# Patient Record
Sex: Female | Born: 1960 | Hispanic: Yes | Marital: Married | Smoking: Never smoker
Health system: Southern US, Community
[De-identification: ages and names within clinical notes are randomized; demographics above are authoritative.]

## PROBLEM LIST (undated history)

## (undated) DIAGNOSIS — G43909 Migraine, unspecified, not intractable, without status migrainosus: Secondary | ICD-10-CM

## (undated) DIAGNOSIS — M1990 Unspecified osteoarthritis, unspecified site: Secondary | ICD-10-CM

## (undated) HISTORY — DX: Unspecified osteoarthritis, unspecified site: M19.90

## (undated) HISTORY — DX: Migraine, unspecified, not intractable, without status migrainosus: G43.909

## (undated) HISTORY — PX: NO PAST SURGERIES: SHX2092

---

## 2009-08-29 ENCOUNTER — Ambulatory Visit: Payer: Self-pay | Admitting: Family

## 2010-11-27 ENCOUNTER — Ambulatory Visit: Payer: Self-pay | Admitting: Family

## 2011-12-03 ENCOUNTER — Ambulatory Visit: Payer: Self-pay

## 2011-12-05 ENCOUNTER — Ambulatory Visit: Payer: Self-pay

## 2012-12-29 ENCOUNTER — Ambulatory Visit: Payer: Self-pay

## 2012-12-29 ENCOUNTER — Ambulatory Visit: Payer: Self-pay | Admitting: Family Medicine

## 2014-01-30 ENCOUNTER — Ambulatory Visit: Payer: Self-pay

## 2015-06-20 ENCOUNTER — Ambulatory Visit
Admission: RE | Admit: 2015-06-20 | Discharge: 2015-06-20 | Disposition: A | Discharge status: Home / Self Care | Payer: Self-pay | Source: Ambulatory Visit | Attending: Oncology | Admitting: Oncology

## 2015-06-20 ENCOUNTER — Ambulatory Visit: Payer: Self-pay | Attending: Oncology

## 2015-06-20 VITALS — BP 100/69 | HR 73 | Temp 98.1°F | Resp 16 | Ht 61.81 in | Wt 135.1 lb

## 2015-06-20 DIAGNOSIS — Z0000 Encounter for general adult medical examination without abnormal findings: Secondary | ICD-10-CM

## 2015-06-20 NOTE — Progress Notes (Signed)
Subjective:     Patient ID: Theresa Olsen, female   DOB: 04/03/1960, 55 y.o.   MRN: [id number]  HPI   Review of Systems     Objective:   Physical Exam  Pulmonary/Chest: Right breast exhibits no inverted nipple, no mass, no nipple discharge, no skin change and no tenderness. Left breast exhibits no inverted nipple, no mass, no nipple discharge, no skin change and no tenderness. Breasts are symmetrical.  Genitourinary: No labial fusion. There is no rash, tenderness, lesion or injury on the right labia. There is no rash, tenderness, lesion or injury on the left labia. Uterus is not deviated, not enlarged, not fixed and not tender. Cervix exhibits no motion tenderness, no discharge and no friability. Right adnexum displays no mass, no tenderness and no fullness. Left adnexum displays tenderness. Left adnexum displays no mass and no fullness. No erythema, tenderness or bleeding in the vagina. No foreign body around the vagina. No signs of injury around the vagina. No vaginal discharge found.         Assessment:     55 year old Hispanic patient presents for [facility] clinic visit.  Patient screened, and meets [facility] eligibility.  Patient does not have insurance, Medicare or Medicaid.  Handout given on Affordable Care Act.  CBE unremarkable.  No mass or lump palpated.  Patient reports she is having intermittent , sharp pelvic pain.  States she had a pelvic ultrasound in the past [facility] on [address], but does not know results.  She is also having joint, and back pain, and states [facility] at [facility], but has not received any results. Will request results to be faxed.  Pelvis exam reveal a bright red polyp in cervical os.  Slight bleeding on collection of pap specimen.  Left sided pelvic tenderness on palpation.     Plan:     Sent for bilateral screening mammogram.  Consult appointment for cervical polyp with Dr. Greggory KeeneFrancesco on June 22,2017 at 10 o'clock.   [facility] office to schedule interpreter.

## 2015-06-23 LAB — PAP LB AND HPV HIGH-RISK
HPV, high-risk: NEGATIVE
PAP SMEAR COMMENT: 0

## 2015-07-19 ENCOUNTER — Ambulatory Visit (INDEPENDENT_AMBULATORY_CARE_PROVIDER_SITE_OTHER): Payer: Self-pay | Admitting: Obstetrics and Gynecology

## 2015-07-19 ENCOUNTER — Encounter: Payer: Self-pay | Admitting: Obstetrics and Gynecology

## 2015-07-19 ENCOUNTER — Other Ambulatory Visit: Payer: Self-pay | Admitting: Obstetrics and Gynecology

## 2015-07-19 VITALS — BP 109/72 | HR 81 | Ht 62.0 in | Wt 135.9 lb

## 2015-07-19 DIAGNOSIS — N951 Menopausal and female climacteric states: Secondary | ICD-10-CM

## 2015-07-19 DIAGNOSIS — N841 Polyp of cervix uteri: Secondary | ICD-10-CM

## 2015-07-19 DIAGNOSIS — N952 Postmenopausal atrophic vaginitis: Secondary | ICD-10-CM

## 2015-07-19 DIAGNOSIS — N852 Hypertrophy of uterus: Secondary | ICD-10-CM

## 2015-07-19 DIAGNOSIS — N941 Unspecified dyspareunia: Secondary | ICD-10-CM

## 2015-07-19 NOTE — Patient Instructions (Signed)
1. Endocervical polyp is removed 2. Ultrasound is to be scheduled for enlarged uterus 3.  Follow-up after ultrasound to review results and further management planning  Cervical Biopsy A cervical biopsy is a procedure to remove a small sample of tissue from the cervix. The cervix is the lowest part of the womb (uterus), which opens into the vagina (birth canal). You may have this procedure to check for cancer or growths that may become cancer. This procedure may also be done if the results of your Pap test were abnormal or if your health care provider saw an abnormality during a pelvic exam. LET [facility] PROVID[facility] KNOW ABOUT:   Any allergies you have.   All medicines you are taking, including vitamins, herbs, eye drops, creams, and over-the-counter medicines.  Previous problems you or members of your family have had with the use of anesthetics.   Any blood disorders you have.  Previous surgeries you have had.  Any medical conditions you have.  Whether you are pregnant or may be pregnant.  Whether you are having your menstrual period or will be having your period at the time of the procedure. RISKS AND COMPLICATIONS Generally, this is a safe procedure. However, problems may occur, including:  Infection.  Bleeding.  Allergic reactions to medicines or dyes.  Damage to other structures or organs. BEFORE THE PROCEDURE  Do not douche, have sex, use tampons, or use any vaginal medicines before the procedure as told by your health care provider.  Follow instructions from your health care provider about eating or drinking restrictions.  Ask your health care provider about:  Changing or stopping your regular medicines. This is especially important if you are taking diabetes medicines or blood thinners.  Taking medicines such as aspirin and ibuprofen. These medicines can thin your blood. Do not take these medicines before your procedure if your health care provider instructs  you not to.  You may be given an over-the-counter pain medicine to take right before the procedure.  You may be asked to empty your bladder and bowel right before the procedure. PROCEDURE   You will undress from the waist down.  You will lie on an examining table and put your feet in stirrups.  To reduce your risk of infection:  Your health care team will wash or sanitize their hands.  Your skin will be washed with soap.  Your health care provider will use a lubricated instrument (speculum) to open your vagina. An instrument that has a magnifying lens and a light (colposcope) will let your health care provider examine the cervix more closely.  You may be given a medicine to numb the area (local anesthetic).  Your health care provider will apply a solution to your cervix. This turns abnormal areas a pale color.  Your health care provider will use an instrument (biopsy forceps) to take one or more small pieces of tissue that appear abnormal.  If there seems to be an abnormal area in the part of your cervix that leads to the uterus (endocervical canal), your health care provider will use an instrument (curette) to scrape tissue from that area. This is called endocervical curettage.  Your health care provider will apply a paste over the biopsy areas to help control bleeding. The procedure may vary among health care providers and hospitals. AFTER THE PROCEDURE It is your responsibility to get the results of your procedure. Ask your health care provider or the department performing the procedure when your results will be ready.  This information is not intended to replace advice given to you by your health care provider. Make sure you discuss any questions you have with your health care provider.   Document Released: 01/13/2005 Document Revised: 10/04/2014 Document Reviewed: 05/31/2014 Elsevier Interactive Patient Education [employer].

## 2015-07-19 NOTE — Progress Notes (Signed)
GYN ENCOUNTER NOTE  Subjective:       Patsy LagerYolanda Algis Greenhousemilia Guevara-Iniguez is a 55 y.o. [id number] female is here for gynecologic evaluation of the following issues:  1.endocervical polyp 2. Dyspareunia.   3. Menopausal symptoms  [facility] of endocervical polyp that was identified on recent evaluation.  Patient is perimenopausal with last menstrual period February 2017. Patient is experiencing occasional spotting with intercourse. Patient states that she also has been experiencing painful intercourse over the past year with penetration and deep thrusting discomfort. Gynecologic History No LMP recorded. Patient is postmenopausal. Last Pap: normal Last mammogram:normal  Obstetric History OB History  Gravida Para Term Preterm AB SAB TAB Ectopic Multiple Living  4 4 4       4     # Outcome Date GA Lbr Len/2nd Weight Sex Delivery Anes PTL Lv  4 Term 1993    F Vag-Spont   Y  3 Term 1986    M Vag-Spont   Y  2 Term 1981    M Vag-Spont   Y  1 Term 1979    F Vag-Spont   Y      Past Medical History  Diagnosis Date  . Migraines   . Arthritis     Past Surgical History  Procedure Laterality Date  . No past surgeries      No current outpatient prescriptions on file prior to visit.   No current facility-administered medications on file prior to visit.    No Known Allergies  Social History   Social History  . Marital Status: Married    Spouse Name: N/A  . Number of Children: N/A  . Years of Education: N/A   Occupational History  . Not on file.   Social History Main Topics  . Smoking status: Never Smoker   . Smokeless tobacco: Not on file  . Alcohol Use: No  . Drug Use: No  . Sexual Activity: Yes    Birth Control/ Protection: None   Other Topics Concern  . Not on file   Social History Narrative    Family History  Problem Relation Age of Onset  . Breast cancer Cousin   . Leukemia Sister   . Diabetes Brother   . Ovarian  cancer Neg Hx     The following portions of the patient's history were reviewed and updated as appropriate: allergies, current medications, past family history, past medical history, past social history, past surgical history and problem list.  Review of Systems Per history of present illness  Objective:   BP 109/72 mmHg  Pulse 81  Ht 5\' 2"  (1.575 m)  Wt 135 lb 14.4 oz (61.644 kg)  BMI 24.85 kg/m2 CONSTITUTIONAL: Well-developed, well-nourished female in no acute distress.  HENT:  Normocephalic, atraumatic.  NECK: Normal range of motion, supple, no masses.  Normal thyroid.  SKIN: Skin is warm and dry. No rash noted. Not diaphoretic. No erythema. No pallor. NEUROLGIC: Alert and oriented to person, place, and time. PSYCHIATRIC: Normal mood and affect. Normal behavior. Normal judgment and thought content. CARDIOVASCULAR:Not Examined RESPIRATORY: Not Examined BREASTS: Not Examined ABDOMEN: Soft, non distended; Non tender.  No Organomegaly. PELVIC:  External Genitalia: mild atrophy  BUS: Normal  Vagina: mild atrophy  Cervix: 1.5 cm endocervical polyp removed with ring forceps  Uterus: slightly enlarged, irregular shape with left-sided prominence,consistency normal, mobile  Adnexa: Normal  RV: Normal external exam  Bladder: Nontender MUSCULOSKELETAL: Normal range of motion. No tenderness.  No cyanosis, clubbing, or  edema.  PROCEDURE: Endocervical polyp removal Ring forceps is applied to the polyp and a clockwise twisting motion is performed with subsequent removal of the specimen;part of the stalk remained and ECC was performed to remove the base of the stalk. Patient was sent for pathologic evaluation. Monsel's solution is applied.   Assessment:   1. Endocervical polyp, removed 2. Enlarged uterus with left-sided prominence consistent with possible fibroid 3.dyspareunia 4. Perimenopausal vasomotor symptoms 5.Vaginal atrophy   Plan:   1. Polyp removal 2. Ultrasound to be  scheduled once authorization is obtained 3. Consider HRT for vasomotor symptoms and vaginal atrophy This will be considered following review of ultrasound.  Herold HarmsMartin A Hazley Dezeeuw, MD  Note: This dictation was prepared with Dragon dictation along with smaller phrase technology. Any transcriptional errors that result from this process are unintentional.

## 2015-07-23 LAB — PATHOLOGY

## 2015-07-24 ENCOUNTER — Telehealth: Payer: Self-pay

## 2015-07-24 NOTE — Telephone Encounter (Signed)
[facility]- she has funds for pt to have u/s only. Any other services she will be self pay. We will need to contact pt with results. Pt scheduled for 08/07/15 at 3pm.

## 2015-08-06 ENCOUNTER — Other Ambulatory Visit: Payer: Self-pay | Admitting: Obstetrics and Gynecology

## 2015-08-06 DIAGNOSIS — N852 Hypertrophy of uterus: Secondary | ICD-10-CM

## 2015-08-07 ENCOUNTER — Ambulatory Visit (INDEPENDENT_AMBULATORY_CARE_PROVIDER_SITE_OTHER): Payer: Self-pay

## 2015-08-07 DIAGNOSIS — N852 Hypertrophy of uterus: Secondary | ICD-10-CM

## 2015-08-16 NOTE — Progress Notes (Unsigned)
Patient had cervical polyp removed by Dr. Greggory KeeneFrancesco. Benign results.  Copy to [facility].  She also had transvaginal ultrasound [facility] per Molli PoseySheena Lambert.  Patient aware if any non cervical findings, will not be covered under [facility].

## 2016-05-19 ENCOUNTER — Emergency Department: Payer: Self-pay

## 2016-05-19 ENCOUNTER — Encounter: Payer: Self-pay | Admitting: *Deleted

## 2016-05-19 ENCOUNTER — Emergency Department
Admission: EM | Admit: 2016-05-19 | Discharge: 2016-05-19 | Disposition: A | Discharge status: Home / Self Care | Payer: Self-pay | Attending: Emergency Medicine | Admitting: Emergency Medicine

## 2016-05-19 DIAGNOSIS — M5431 Sciatica, right side: Secondary | ICD-10-CM

## 2016-05-19 DIAGNOSIS — M5416 Radiculopathy, lumbar region: Secondary | ICD-10-CM | POA: Insufficient documentation

## 2016-05-19 DIAGNOSIS — M5441 Lumbago with sciatica, right side: Secondary | ICD-10-CM | POA: Insufficient documentation

## 2016-05-19 LAB — URINALYSIS, COMPLETE (UACMP) WITH MICROSCOPIC
BILIRUBIN URINE: NEGATIVE
Bacteria, UA: NONE SEEN
GLUCOSE, UA: NEGATIVE mg/dL
KETONES UR: NEGATIVE mg/dL
NITRITE: NEGATIVE
PH: 6 (ref 5.0–8.0)
Protein, ur: NEGATIVE mg/dL
Specific Gravity, Urine: 1.008 (ref 1.005–1.030)

## 2016-05-19 MED ORDER — HYDROCODONE-ACETAMINOPHEN 5-325 MG PO TABS: 1.0000 | ORAL_TABLET | ORAL | 0 refills | Status: AC | PRN

## 2016-05-19 MED ORDER — PREDNISONE 10 MG (21) PO TBPK: ORAL_TABLET | ORAL | 0 refills | Status: AC

## 2016-05-19 MED ORDER — CYCLOBENZAPRINE HCL 10 MG PO TABS: 10.0000 mg | ORAL_TABLET | Freq: Three times a day (TID) | ORAL | 0 refills | Status: AC | PRN

## 2016-05-19 NOTE — ED Triage Notes (Signed)
States right lower back pain for 1 year but states not the pain radiates down her right leg, pain increasing with movement, denies any urinary symptoms

## 2016-05-19 NOTE — ED Provider Notes (Signed)
[facility] Emergency Department Provider Note ____________________________________________  Time seen: Approximately 3:50 PM  I have reviewed the triage vital signs and the nursing notes.   HISTORY  Chief Complaint Back Pain    HPI Theresa Olsen is a 56 y.o. female who presents to the emergency department for evaluation of right lower back pain for about a year that now radiates down the right leg. Pain increases with movement. Symptoms have worsened over the past 3 month and worse again over the past couple of days. She has not taken any medications for this pain. She denies loss of bowel or bladder control or saddle anesthesia. She denies new injury.   Past Medical History:  Diagnosis Date  . Arthritis   . Migraines     Patient Active Problem List   Diagnosis Date Noted  . Endocervical polyp 07/19/2015  . Enlarged uterus 07/19/2015  . Perimenopausal vasomotor symptoms 07/19/2015  . Vaginal atrophy 07/19/2015  . Female dyspareunia 07/19/2015    Past Surgical History:  Procedure Laterality Date  . NO PAST SURGERIES      Prior to Admission medications   Medication Sig Start Date End Date Taking? Authorizing Provider  calcium carbonate (OSCAL) 1500 (600 Ca) MG TABS tablet Take by mouth 2 (two) times daily with a meal.    Historical Provider, MD  cyclobenzaprine (FLEXERIL) 10 MG tablet Take 1 tablet (10 mg total) by mouth 3 (three) times daily as needed for muscle spasms. 05/19/16   Chinita Pester, FNP  HYDROcodone-acetaminophen (NORCO/VICODIN) 5-325 MG tablet Take 1 tablet by mouth every 4 (four) hours as needed for moderate pain. 05/19/16 05/19/17  Chinita Pester, FNP  predniSONE (STERAPRED UNI-PAK 21 TAB) 10 MG (21) TBPK tablet Take 6 tablets on day 1 Take 5 tablets on day 2 Take 4 tablets on day 3 Take 3 tablets on day 4 Take 2 tablets on day 5 Take 1 tablet on day 6 05/19/16   Chinita Pester, FNP    Allergies Patient has no  known allergies.  Family History  Problem Relation Age of Onset  . Breast cancer Cousin   . Leukemia Sister   . Diabetes Brother   . Ovarian cancer Neg Hx     Social History Social History  Substance Use Topics  . Smoking status: Never Smoker  . Smokeless tobacco: Not on file  . Alcohol use No    Review of Systems Constitutional: No recent illness. Cardiovascular: Denies chest pain or palpitations. Respiratory: Denies shortness of breath. Musculoskeletal: Pain in right lower back with radiation into the posterior lower extremity. Skin: Negative for rash, wound, lesion. Neurological: Negative for focal weakness or numbness.  ____________________________________________   PHYSICAL EXAM:  VITAL SIGNS: ED Triage Vitals [05/19/16 1518]  Enc Vitals Group     BP 124/88     Pulse Rate 76     Resp 18     Temp 98.2 F (36.8 C)     Temp Source Oral     SpO2 100 %     Weight 135 lb (61.2 kg)     Height  (1.549 m)     Head Circumference      Peak Flow      Pain Score 5     Pain Loc      Pain Edu?      Excl. in GC?     Constitutional: Alert and oriented. Well appearing and in no acute distress. Eyes: Conjunctivae are normal. EOMI.  Head: Atraumatic. Neck: No stridor.  Respiratory: Normal respiratory effort.   Musculoskeletal: No focal midline tenderness of the thoracic or lumbar spine. No specific CVA tenderness. Straight leg raise on the right is possible, but pain increases at about 35*.  Neurologic:  Normal speech and language. No gross focal neurologic deficits are appreciated. Speech is normal. No gait instability. Skin:  Skin is warm, dry and intact. Atraumatic. Psychiatric: Mood and affect are normal. Speech and behavior are normal.  ____________________________________________   LABS (all labs ordered are listed, but only abnormal results are displayed)  Labs Reviewed  URINALYSIS, COMPLETE (UACMP) WITH MICROSCOPIC - Abnormal; Notable for the following:        Result Value   Color, Urine YELLOW (*)    APPearance CLEAR (*)    Hgb urine dipstick SMALL (*)    Leukocytes, UA MODERATE (*)    Squamous Epithelial / LPF 0-5 (*)    All other components within normal limits   ____________________________________________  RADIOLOGY  Lumbar spine film negative for acute abnormality per radiology. ____________________________________________   PROCEDURES  Procedure(s) performed: None  ____________________________________________   INITIAL IMPRESSION / ASSESSMENT AND PLAN / ED COURSE  56 year old female who presents to the emergency department for evaluation of right lower back pain. Symptoms are likely sciatica. She will be given prescriptions for prednisone, norco, and flexeril. She is to follow up with orthopedics for symptoms that are not improving over the week. She is to return to the ER for symptoms that change or worsen if unable to schedule an appointment.  Pertinent labs & imaging results that were available during my care of the patient were reviewed by me and considered in my medical decision making (see chart for details).  _________________________________________   FINAL CLINICAL IMPRESSION(S) / ED DIAGNOSES  Final diagnoses:  Lumbar radiculopathy, acute  Sciatica of right side    Discharge Medication List as of 05/19/2016  4:57 PM    START taking these medications   Details  cyclobenzaprine (FLEXERIL) 10 MG tablet Take 1 tablet (10 mg total) by mouth 3 (three) times daily as needed for muscle spasms., Starting Mon 05/19/2016, Print    HYDROcodone-acetaminophen (NORCO/VICODIN) 5-325 MG tablet Take 1 tablet by mouth every 4 (four) hours as needed for moderate pain., Starting Mon 05/19/2016, Until Tue 05/19/2017, Print    predniSONE (STERAPRED UNI-PAK 21 TAB) 10 MG (21) TBPK tablet Take 6 tablets on day 1 Take 5 tablets on day 2 Take 4 tablets on day 3 Take 3 tablets on day 4 Take 2 tablets on day 5 Take 1 tablet on  day 6, Print        If controlled substance prescribed during this visit, 12 month history viewed on the NCCSRS prior to issuing an initial prescription for Schedule II or III opiod.    Chinita Pester, FNP 05/19/16 1712    Jeanmarie Plant, MD 05/19/16 2137

## 2016-09-03 ENCOUNTER — Ambulatory Visit: Payer: Self-pay | Attending: Oncology | Admitting: *Deleted

## 2016-09-03 ENCOUNTER — Encounter (INDEPENDENT_AMBULATORY_CARE_PROVIDER_SITE_OTHER): Payer: Self-pay

## 2016-09-03 ENCOUNTER — Ambulatory Visit
Admission: RE | Admit: 2016-09-03 | Discharge: 2016-09-03 | Disposition: A | Discharge status: Home / Self Care | Payer: Self-pay | Source: Ambulatory Visit | Attending: Oncology | Admitting: Oncology

## 2016-09-03 VITALS — BP 108/76 | HR 85 | Temp 98.6°F | Ht 62.0 in | Wt 128.0 lb

## 2016-09-03 DIAGNOSIS — Z0000 Encounter for general adult medical examination without abnormal findings: Secondary | ICD-10-CM

## 2016-09-03 NOTE — Patient Instructions (Signed)
Gave patient hand-out, Women Staying Healthy, Active and Well from [facility], with education on breast health, pap smears, heart and colon health. 

## 2016-09-03 NOTE — Progress Notes (Signed)
Subjective:     Patient ID: Theresa Olsen, female   DOB: 01/17/1961, 56 y.o.   MRN: [id number]  HPI   Review of Systems     Objective:   Physical Exam  Pulmonary/Chest: Right breast exhibits tenderness. Right breast exhibits no inverted nipple, no mass, no nipple discharge and no skin change. Left breast exhibits tenderness. Left breast exhibits no inverted nipple, no mass, no nipple discharge and no skin change.  Tenderness noted on exam        Assessment:     56 year old Hispanic [facility]  Lloyda, the interpreter present during the interview and exam.  Bilateral breast with tenderness to palpation noted on clinical breast exam.  States she drinks only 1 cup of coffee per day.  States the breast are only tender when touched.  No dominant mass, skin changes, nipple discharge or lymphadenopathy noted on exam.  Last pap on 06/20/15 was negative / negative.  Patient prefers to wait until next year to have another pelvic exam.  Next pap due in 2022.  Patient has been screened for eligibility.  She does not have any insurance, Medicare or Medicaid.  She also meets financial eligibility.  Hand-out given on the Affordable Care Act.    Plan:     Screening mammogram ordered.  Will follow-up per [facility] protocol.

## 2016-09-04 ENCOUNTER — Encounter: Payer: Self-pay | Admitting: *Deleted

## 2016-09-04 NOTE — Progress Notes (Signed)
Letter mailed from the [facility] to inform patient of her normal mammogram results.  Patient is to follow-up with annual screening in one year.  [facility] to [facility]. 

## 2019-04-15 ENCOUNTER — Ambulatory Visit: Payer: Self-pay | Attending: Internal Medicine

## 2019-04-15 DIAGNOSIS — Z23 Encounter for immunization: Secondary | ICD-10-CM

## 2019-04-15 NOTE — Progress Notes (Signed)
   Covid-19 Vaccination Clinic  Name:  Theresa Olsen    MRN: [id number] DOB: 1960/05/26  04/15/2019  Theresa Olsen was observed post Covid-19 immunization for 15 minutes without incident. She was provided with Vaccine Information Sheet and instruction to access the V-Safe system.   Theresa Olsen was instructed to call 911 with any severe reactions post vaccine: Marland Kitchen Difficulty breathing  . Swelling of face and throat  . A fast heartbeat  . A bad rash all over body  . Dizziness and weakness   Immunizations Administered    Name Date Dose VIS Date Route   Pfizer COVID-19 Vaccine 04/15/2019  4:58 PM 0.3 mL 01/07/2019 Intramuscular   Manufacturer: [employer], [employer]   Lot: [id number]   NDC: [address]-7

## 2019-05-06 ENCOUNTER — Ambulatory Visit: Payer: Self-pay | Attending: Internal Medicine

## 2019-05-06 DIAGNOSIS — Z23 Encounter for immunization: Secondary | ICD-10-CM

## 2019-05-06 NOTE — Progress Notes (Signed)
   Covid-19 Vaccination Clinic  Name:  Theresa Olsen    MRN: [id number] DOB: Mar 04, 1960  05/06/2019  Theresa Olsen was observed post Covid-19 immunization for 15 minutes without incident. She was provided with Vaccine Information Sheet and instruction to access the V-Safe system.   Theresa Olsen was instructed to call 911 with any severe reactions post vaccine: Marland Kitchen Difficulty breathing  . Swelling of face and throat  . A fast heartbeat  . A bad rash all over body  . Dizziness and weakness   Immunizations Administered    Name Date Dose VIS Date Route   Pfizer COVID-19 Vaccine 05/06/2019  3:50 PM 0.3 mL 01/07/2019 Intramuscular   Manufacturer: [employer], [employer]   Lot: [id number]   NDC: [address]-1

## 2019-07-06 ENCOUNTER — Ambulatory Visit
Admission: RE | Admit: 2019-07-06 | Discharge: 2019-07-06 | Disposition: A | Discharge status: Home / Self Care | Payer: Self-pay | Source: Ambulatory Visit | Attending: Oncology | Admitting: Oncology

## 2019-07-06 ENCOUNTER — Ambulatory Visit: Payer: Self-pay | Attending: Oncology

## 2019-07-06 ENCOUNTER — Other Ambulatory Visit: Payer: Self-pay

## 2019-07-06 ENCOUNTER — Encounter (INDEPENDENT_AMBULATORY_CARE_PROVIDER_SITE_OTHER): Payer: Self-pay

## 2019-07-06 VITALS — BP 111/78 | HR 65 | Ht 61.1 in | Wt 133.7 lb

## 2019-07-06 DIAGNOSIS — Z0000 Encounter for general adult medical examination without abnormal findings: Secondary | ICD-10-CM

## 2019-07-06 NOTE — Progress Notes (Signed)
  Subjective:     Patient ID: Theresa Olsen, female   DOB: Aug 31, 1960, 59 y.o.   MRN: [id number]  HPI   Review of Systems     Objective:   Physical Exam Chest:     Breasts:        Right: No swelling, bleeding, inverted nipple, mass, nipple discharge, skin change or tenderness.        Left: No swelling, bleeding, inverted nipple, mass, nipple discharge, skin change or tenderness.        Assessment:     59 year old patient presents for [facility] clinic visit..Patient screened, and meets [facility] eligibility.  Patient does not have insurance, Medicare or Medicaid.  Instructed patient on breast self awareness using teach back method.  Clinical breast exam unremarkable.  No mass or lump palpated. Risk Assessment    Risk Scores      07/06/2019   Last edited by: Alta Corning, CMA   5-year risk: 0.6 %   Lifetime risk: 3.4 %            Plan:     Sent for bilateral screening mammogram.

## 2019-07-27 NOTE — Progress Notes (Signed)
Letter mailed from [facility] to notify of normal mammogram results.  Patient to return in one year for annual screening.  Copy to [facility]. 

## 2021-01-08 ENCOUNTER — Ambulatory Visit
Admission: RE | Admit: 2021-01-08 | Discharge: 2021-01-08 | Disposition: A | Discharge status: Home / Self Care | Payer: Self-pay | Source: Ambulatory Visit | Attending: Oncology | Admitting: Oncology

## 2021-01-08 ENCOUNTER — Ambulatory Visit: Payer: Self-pay | Attending: Oncology | Admitting: *Deleted

## 2021-01-08 ENCOUNTER — Other Ambulatory Visit: Payer: Self-pay

## 2021-01-08 VITALS — BP 123/75 | HR 60 | Temp 97.7°F | Wt 136.5 lb

## 2021-01-08 DIAGNOSIS — Z0000 Encounter for general adult medical examination without abnormal findings: Secondary | ICD-10-CM

## 2021-01-08 NOTE — Progress Notes (Signed)
°  Subjective:     Patient ID: Theresa Olsen, female   DOB: 16-Mar-1960, 60 y.o.   MRN: [id number]  HPI  [facility] Medical History Record - 01/08/21 5885       Breast History   Screening cycle New    Provider ([facility]) Phineas Real    Initial Mammogram 01/08/21    Last Mammogram Annual    Recent Breast Symptoms None      Breast Cancer History   Comments/Details maternal 1st cousin breast cancer- deceased.  Sister- leukemia      Previous History of Breast Problems   Breast Surgery or Biopsy None    Breast Implants N/A    BSE Done Monthly      Gynecological/Obstetrical History   Is there any chance that the client could be pregnant?  No    Age at menarche 22    Age at menopause 26    PAP smear history Annually    Date of last PAP  07/09/20    Provider (PAP) Phineas Real per patient;  Copy requested    Age at first live birth 13    Breast fed children Yes (type length in comments)   1 year   DES Exposure No    Cervical, Uterine or Ovarian cancer No    Family history of Cervial, Uterine or Ovarian cancer No    Hysterectomy No    Cervix removed No    Ovaries removed No    Laser/Cryosurgery No    Current method of birth control None    Current method of Estrogen/Hormone replacement None    Smoking history None               Review of Systems     Objective:   Physical Exam Chest:  Breasts:    Right: No swelling, bleeding, inverted nipple, mass, nipple discharge, skin change or tenderness.     Left: No swelling, bleeding, inverted nipple, mass, nipple discharge, skin change or tenderness.  Lymphadenopathy:     Upper Body:     Right upper body: No supraclavicular or axillary adenopathy.     Left upper body: No supraclavicular or axillary adenopathy.       Assessment:     60 year old Hispanic female returns to [facility] for annual exam.  Francisco, the interpreter from [employer] video present during the interview and exam.  Clinical breast exam is unremarkable.   Taught self breast awareness.  Last pap was in 2021.  Results are not available for review.  Patient has been screened for eligibility.  She does not have any insurance, Medicare or Medicaid.  She also meets financial eligibility.      Plan:     Screening mammogram ordered.  Next pap due per ASCCP guidelines.  Will follow up per [facility] protocol.

## 2021-01-09 ENCOUNTER — Encounter: Payer: Self-pay | Admitting: *Deleted

## 2021-01-09 NOTE — Progress Notes (Signed)
Letter mailed from the [facility] to inform patient of her normal mammogram results.  Patient is to follow-up with annual screening in one year. 

## 2022-08-26 ENCOUNTER — Encounter: Payer: Self-pay | Admitting: Family Medicine

## 2022-10-23 ENCOUNTER — Other Ambulatory Visit: Payer: Self-pay

## 2022-10-23 DIAGNOSIS — Z1231 Encounter for screening mammogram for malignant neoplasm of breast: Secondary | ICD-10-CM

## 2022-11-10 ENCOUNTER — Ambulatory Visit
Admission: RE | Admit: 2022-11-10 | Discharge: 2022-11-10 | Disposition: A | Discharge status: Home / Self Care | Payer: Self-pay | Source: Ambulatory Visit | Attending: Obstetrics and Gynecology | Admitting: Obstetrics and Gynecology

## 2022-11-10 ENCOUNTER — Ambulatory Visit: Payer: Self-pay | Attending: Hematology and Oncology | Admitting: Hematology and Oncology

## 2022-11-10 VITALS — BP 132/79 | Wt 138.3 lb

## 2022-11-10 DIAGNOSIS — Z1231 Encounter for screening mammogram for malignant neoplasm of breast: Secondary | ICD-10-CM

## 2022-11-10 NOTE — Patient Instructions (Signed)
Taught Theresa Olsen about self breast awareness and gave educational materials to take home. Patient did not need a Pap smear today due to last Pap smear was in 05/30/2020 per patient. Let her know [facility] will cover Pap smears every 5 years unless has a history of abnormal Pap smears. Referred patient to the [facility] for screening mammogram. Appointment scheduled for 11/10/2022. Patient aware of appointment and will be there. Let patient know will follow up with her within the next couple weeks with results. Theresa Olsen verbalized understanding.  Pascal Lux, NP 3:57 PM

## 2022-11-10 NOTE — Progress Notes (Signed)
Theresa Olsen is a 62 y.o. female who presents to [facility] clinic today with no complaints.    Pap Smear: Pap not smear completed today. Last Pap smear was 05/30/2020 at [facility] clinic and was normal. Per patient has no history of an abnormal Pap smear. Last Pap smear result is available in Epic.   Physical exam: Breasts Breasts symmetrical. No skin abnormalities bilateral breasts. No nipple retraction bilateral breasts. No nipple discharge bilateral breasts. No lymphadenopathy. No lumps palpated bilateral breasts.  MS DIGITAL SCREENING TOMO BILATERAL  Result Date: 01/08/2021 CLINICAL DATA:  Screening. EXAM: DIGITAL SCREENING BILATERAL MAMMOGRAM WITH TOMOSYNTHESIS AND CAD TECHNIQUE: Bilateral screening digital craniocaudal and mediolateral oblique mammograms were obtained. Bilateral screening digital breast tomosynthesis was performed. The images were evaluated with computer-aided detection. COMPARISON:  Previous exam(s). ACR Breast Density Category b: There are scattered areas of fibroglandular density. FINDINGS: There are no findings suspicious for malignancy. IMPRESSION: No mammographic evidence of malignancy. A result letter of this screening mammogram will be mailed directly to the patient. RECOMMENDATION: Screening mammogram in one year. (Code:SM-B-01Y) BI-RADS CATEGORY  1: Negative. Electronically Signed   By: Theresa Olsen M.D.   On: 01/08/2021 16:15  MS DIGITAL SCREENING TOMO BILATERAL  Result Date: 07/07/2019 CLINICAL DATA:  Screening. EXAM: DIGITAL SCREENING BILATERAL MAMMOGRAM WITH TOMO AND CAD COMPARISON:  Previous exam(s). ACR Breast Density Category b: There are scattered areas of fibroglandular density. FINDINGS: There are no findings suspicious for malignancy. Images were processed with CAD. IMPRESSION: No mammographic evidence of malignancy. A result letter of this screening mammogram will be mailed directly to the patient. RECOMMENDATION: Screening mammogram in  one year. (Code:SM-B-01Y) BI-RADS CATEGORY  1: Negative. Electronically Signed   By: Theresa Olsen M.D.   On: 07/07/2019 11:07         Pelvic/Bimanual Pap is not indicated today    Smoking History: Patient has never smoked and was not referred to quit line.    Patient Navigation: Patient education provided. Access to services provided for patient through [facility] program. Theresa Olsen interpreter provided. No transportation provided   Colorectal Cancer Screening: Per patient has never had colonoscopy completed No complaints today.    Breast and Cervical Cancer Risk Assessment: Patient does not have family history of breast cancer, known genetic mutations, or radiation treatment to the chest before age 31. Patient does not have history of cervical dysplasia, immunocompromised, or DES exposure in-utero.  Risk Scores as of Encounter on 11/10/2022     Theresa Olsen           5-year 1.06%   Lifetime 4.3%   This patient is [place] but has no documented birth country, so the [place] model used data from [place] patients to calculate their risk score. Document a birth country in the Demographics activity for a more accurate score.         Last calculated by Theresa Rutherford, LPN on 96/29/5284 at  3:43 PM        A: [facility] exam without pap smear No complaints with benign exam.   P: Referred patient to the [facility] for a screening mammogram. Appointment scheduled 11/10/2022.  Theresa Lux, NP 11/10/2022 3:55 PM

## 2022-12-25 ENCOUNTER — Emergency Department: Payer: Self-pay

## 2022-12-25 ENCOUNTER — Other Ambulatory Visit: Payer: Self-pay

## 2022-12-25 ENCOUNTER — Emergency Department
Admission: EM | Admit: 2022-12-25 | Discharge: 2022-12-25 | Disposition: A | Discharge status: Home / Self Care | Payer: Self-pay | Attending: Emergency Medicine | Admitting: Emergency Medicine

## 2022-12-25 DIAGNOSIS — W19XXXA Unspecified fall, initial encounter: Secondary | ICD-10-CM

## 2022-12-25 DIAGNOSIS — Y92009 Unspecified place in unspecified non-institutional (private) residence as the place of occurrence of the external cause: Secondary | ICD-10-CM | POA: Insufficient documentation

## 2022-12-25 DIAGNOSIS — S62001A Unspecified fracture of navicular [scaphoid] bone of right wrist, initial encounter for closed fracture: Secondary | ICD-10-CM | POA: Insufficient documentation

## 2022-12-25 DIAGNOSIS — W010XXA Fall on same level from slipping, tripping and stumbling without subsequent striking against object, initial encounter: Secondary | ICD-10-CM | POA: Insufficient documentation

## 2022-12-25 MED ORDER — ACETAMINOPHEN 325 MG PO TABS
650.0000 mg | ORAL_TABLET | Freq: Once | ORAL | Status: AC
Start: 2022-12-25 — End: 2022-12-25
  Administered 2022-12-25: 650 mg via ORAL
  Filled 2022-12-25: qty 2

## 2022-12-25 MED ORDER — NAPROXEN 500 MG PO TABS: 500.0000 mg | ORAL_TABLET | Freq: Two times a day (BID) | ORAL | 0 refills | Status: AC

## 2022-12-25 NOTE — ED Provider Notes (Signed)
[facility] Provider Note    Event Date/Time   First MD Initiated Contact with Patient 12/25/22 0719     (approximate)   History   Fall   HPI  Theresa Olsen is a 62 y.o. female who presents today for evaluation of right wrist pain.  Patient reports that she had a mechanical fall last evening when she tripped over something in the dark.  She landed on her right outstretched hand and has had pain in her right wrist ever since.  No numbness or tingling.  She denies head strike or LOC.  She denies any other injury sustained.  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/19/2015   Enlarged uterus 07/19/2015   Perimenopausal vasomotor symptoms 07/19/2015   Vaginal atrophy 07/19/2015   Female dyspareunia 07/19/2015          Physical Exam   Triage Vital Signs: ED Triage Vitals  Encounter Vitals Group     BP 12/25/22 0641 (!) 117/92     Systolic BP Percentile --      Diastolic BP Percentile --      Pulse Rate 12/25/22 0641 72     Resp 12/25/22 0641 16     Temp 12/25/22 0641 98 F (36.7 C)     Temp Source 12/25/22 0641 Oral     SpO2 12/25/22 0641 97 %     Weight 12/25/22 0641 138 lb (62.6 kg)     Height 12/25/22 0641 5\' 1"  (1.549 m)     Head Circumference --      Peak Flow --      Pain Score 12/25/22 0655 7     Pain Loc --      Pain Education --      Exclude from Growth Chart --     Most recent vital signs: Vitals:   12/25/22 0641  BP: (!) 117/92  Pulse: 72  Resp: 16  Temp: 98 F (36.7 C)  SpO2: 97%    Physical Exam Vitals and nursing note reviewed.  Constitutional:      General: Awake and alert. No acute distress.    Appearance: Normal appearance. The patient is normal weight.  HENT:     Head: Normocephalic and atraumatic.     Mouth: Mucous membranes are moist.  Eyes:     General: PERRL. Normal EOMs        Right eye: No discharge.        Left eye: No discharge.      Conjunctiva/sclera: Conjunctivae normal.  Cardiovascular:     Rate and Rhythm: Normal rate and regular rhythm.     Pulses: Normal pulses.  Pulmonary:     Effort: Pulmonary effort is normal. No respiratory distress.     Breath sounds: Normal breath sounds.  Abdominal:     Abdomen is soft. There is no abdominal tenderness. No rebound or guarding. No distention. Musculoskeletal:        General: No swelling. Normal range of motion.     Cervical back: Normal range of motion and neck supple.  No cervical spine tenderness. Right wrist: Tenderness and swelling over the radial aspect of the distal wrist.  Positive snuffbox tenderness.  Pain with supinating and pronating, though able to do so.  Normal intrinsic muscle function.  Normal radial pulse.  No open wounds. Skin:    General: Skin is warm and dry.     Capillary Refill: Capillary refill takes less than 2  seconds.     Findings: No rash.  Neurological:     Mental Status: The patient is awake and alert.      ED Results / Procedures / Treatments   Labs (all labs ordered are listed, but only abnormal results are displayed) Labs Reviewed - No data to display   EKG     RADIOLOGY I independently reviewed and interpreted imaging and agree with radiologists findings.     PROCEDURES:  Critical Care performed:   Procedures   MEDICATIONS ORDERED IN ED: Medications  acetaminophen (TYLENOL) tablet 650 mg (650 mg Oral Given 12/25/22 0813)     IMPRESSION / MDM / ASSESSMENT AND PLAN / ED COURSE  I reviewed the triage vital signs and the nursing notes.   Differential diagnosis includes, but is not limited to, scaphoid fracture, distal radius fracture, contusion, sprain.  Patient is awake and alert, hemodynamically stable and afebrile.  She is neurovascularly intact with normal radial pulse, sensation intact to all of her fingers and normal intrinsic muscle function of her hand.  She has tenderness to her scaphoid, and x-ray  reveals a scaphoid fracture.  She was placed in a thumb spica splint.  We discussed the importance of close outpatient follow-up with orthopedics and the appropriate follow-up information was provided.  We discussed return precautions and splint care in the meantime.  She requested pain medication and was given a prescription for naproxen though advised that she cannot take this with any other NSAID.  Patient understands and agrees with plan.  She was discharged in stable condition.  Spanish interpreter was used for full history, physical exam, discussion of results, recommendations, follow-up instructions, and return precautions.  All questions were answered.   Patient's presentation is most consistent with acute complicated illness / injury requiring diagnostic workup.    FINAL CLINICAL IMPRESSION(S) / ED DIAGNOSES   Final diagnoses:  Closed nondisplaced fracture of scaphoid of right wrist, unspecified portion of scaphoid, initial encounter  Fall, initial encounter     Rx / DC Orders   ED Discharge Orders          Ordered    naproxen (NAPROSYN) 500 MG tablet  2 times daily with meals        12/25/22 0981             Note:  This document was prepared using Dragon voice recognition software and may include unintentional dictation errors.   Keturah Shavers 12/25/22 1056    Concha Se, MD 12/25/22 [phone]

## 2022-12-25 NOTE — Discharge Instructions (Signed)
Please call the orthopedic department to arrange follow-up for your scaphoid fracture.  Please keep your splint clean and dry.  Please return for any new, worsening, or change in symptoms or other concerns.  You may continue to take Tylenol/ibuprofen per package instructions to help with your pain.  It was a pleasure caring for you today.

## 2022-12-25 NOTE — ED Notes (Signed)
Pt provided with ice pack.

## 2022-12-25 NOTE — ED Triage Notes (Signed)
Pt reports fall at home. Reports fall was mechanical. Denies hitting head or LOC.  C/o R hand and and wrist pain. Presents with makeshift sling from home. CMS intact and cap refill normal. Denies pain elsewhere. Pt alert and oriented. Breathing unlabored. Ambulatory.   Interpreter used for triage

## 2023-07-27 IMAGING — MG MM DIGITAL SCREENING BILAT W/ TOMO AND CAD
8 series · 8 of 24 positions shown · non-contrast
Comparison: Previous exam(s).

CLINICAL DATA: Screening.

EXAM:
DIGITAL SCREENING BILATERAL MAMMOGRAM WITH TOMOSYNTHESIS AND CAD
TECHNIQUE: Bilateral screening digital craniocaudal and mediolateral oblique
mammograms were obtained. Bilateral screening digital breast
tomosynthesis was performed. The images were evaluated with
computer-aided detection.

[R CC synth-2D]
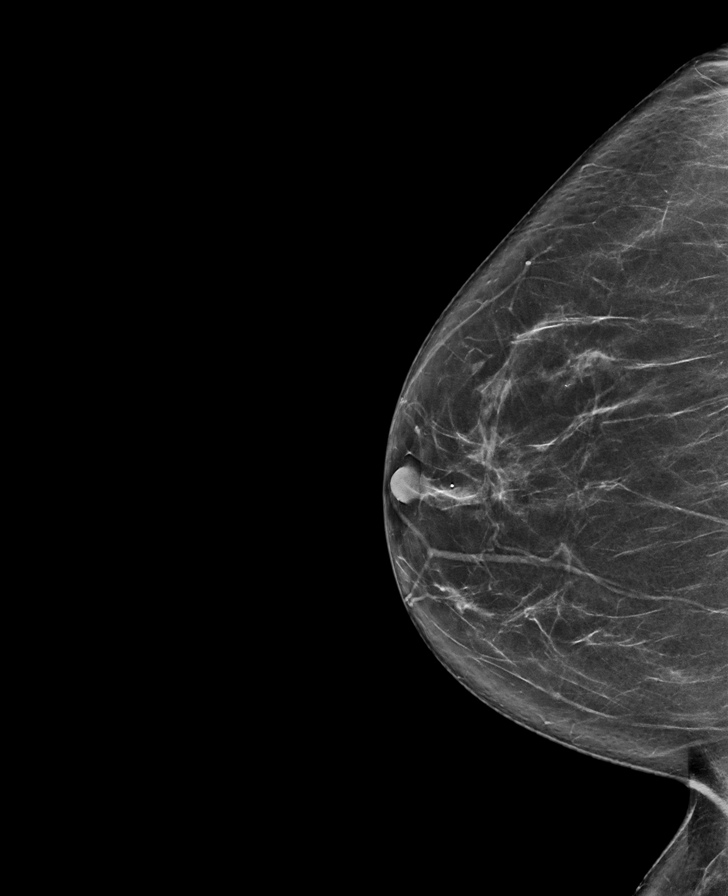

[L CC synth-2D]
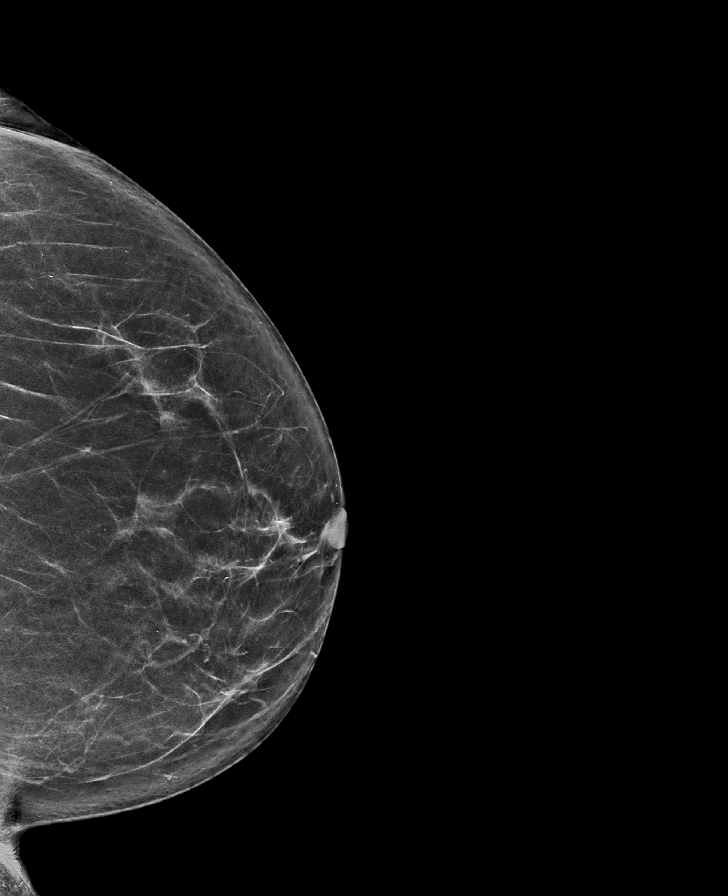

[L MLO synth-2D]
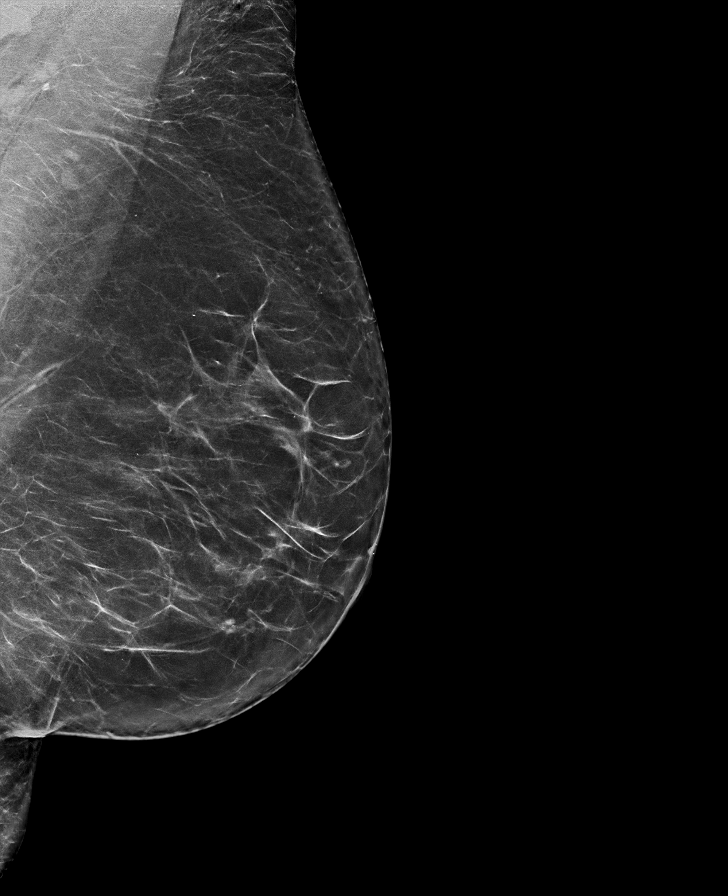

[R MLO synth-2D]
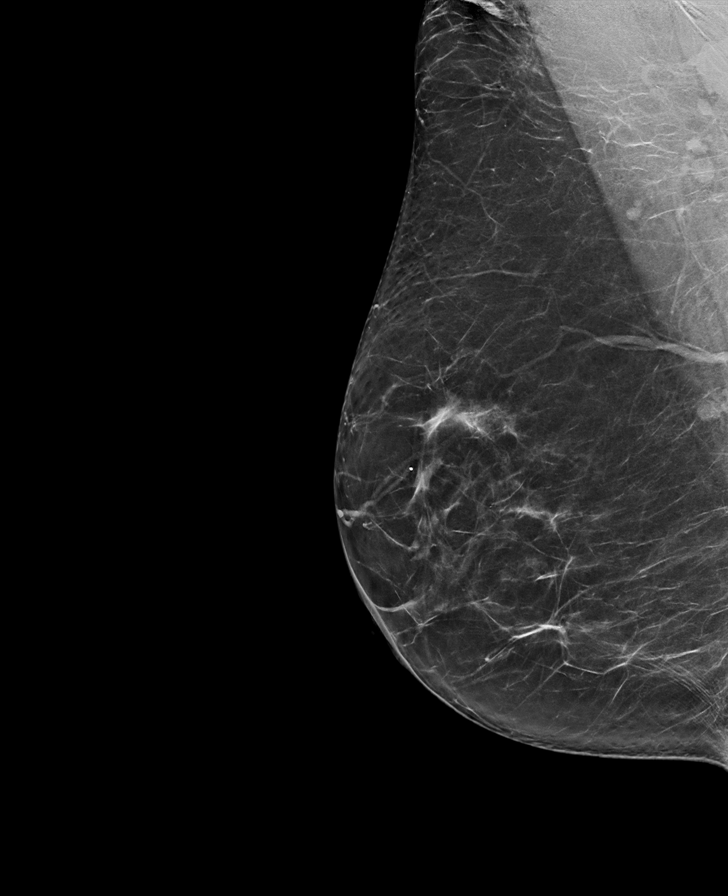

[L MLO tomo · tomo slice 41/82.0]
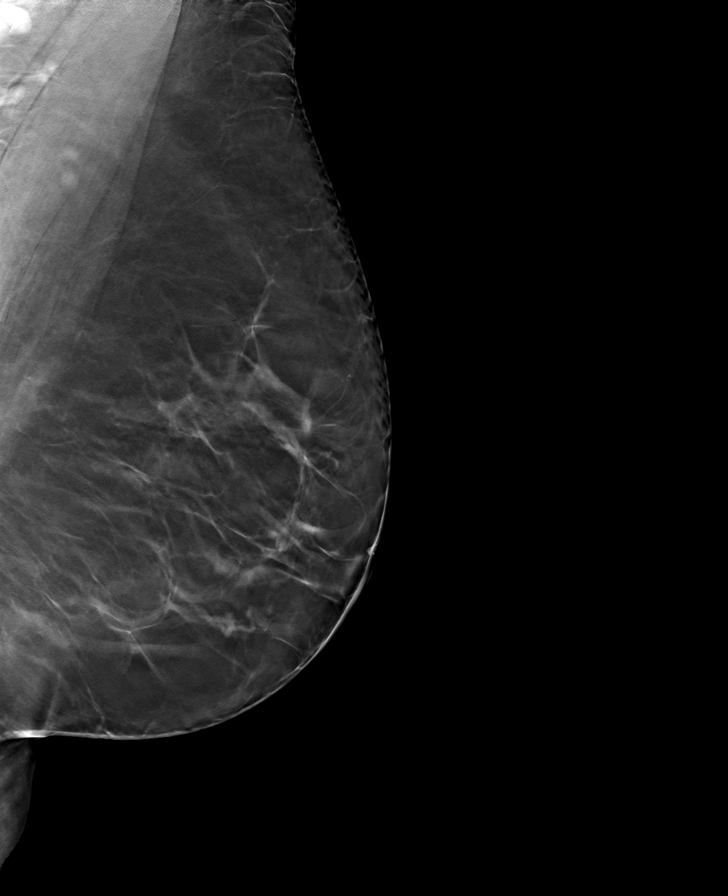

[R CC tomo · tomo slice 40/79.0]
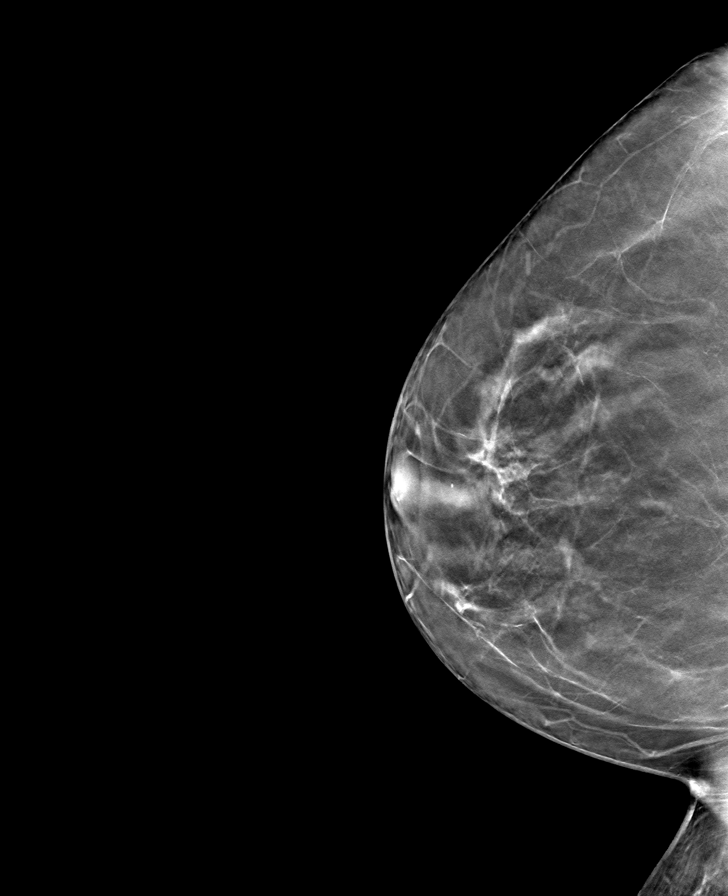

[R MLO tomo · tomo slice 41/80.0]
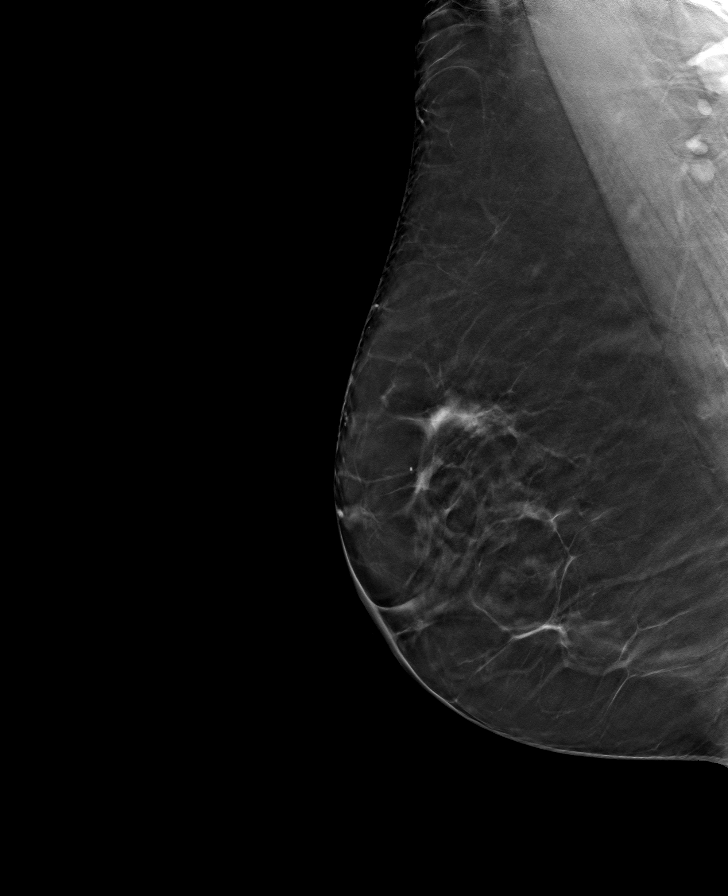

[L CC tomo · tomo slice 38/75.0]
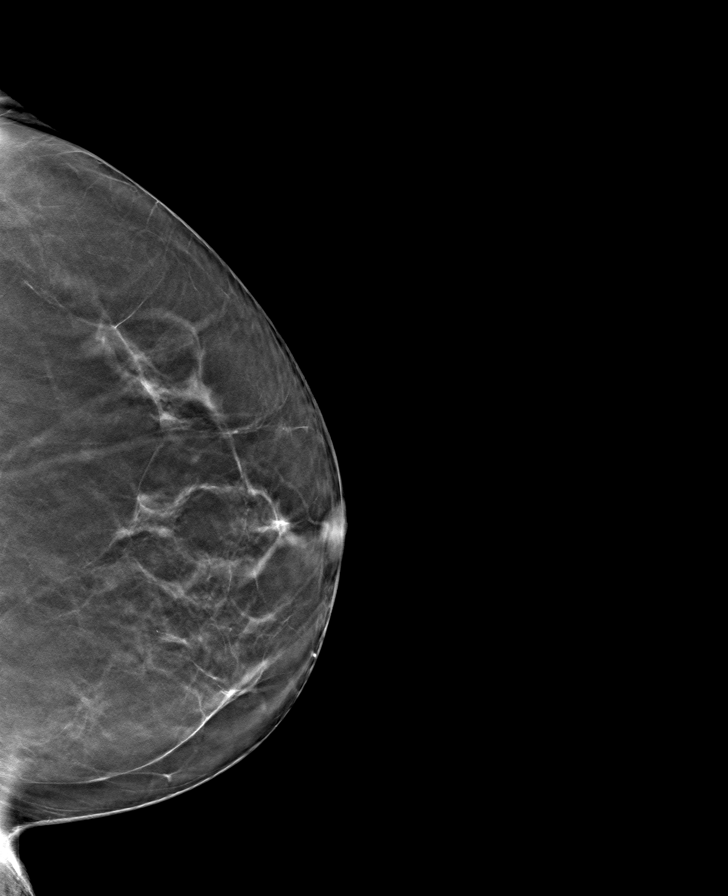

[8 of 24 positions shown; findings below may reference images not displayed]

ACR Breast Density Category b: There are scattered areas of
fibroglandular density.
FINDINGS: There are no findings suspicious for malignancy.
IMPRESSION: No mammographic evidence of malignancy. A result letter of this
screening mammogram will be mailed directly to the patient.

RECOMMENDATION:
Screening mammogram in one year. (Code:51-O-LD2)

BI-RADS CATEGORY  1: Negative.

## 2023-10-28 ENCOUNTER — Other Ambulatory Visit: Payer: Self-pay | Admitting: Primary Care

## 2023-10-28 DIAGNOSIS — Z1231 Encounter for screening mammogram for malignant neoplasm of breast: Secondary | ICD-10-CM

## 2023-12-01 ENCOUNTER — Ambulatory Visit: Payer: Self-pay

## 2023-12-01 ENCOUNTER — Ambulatory Visit
Admission: RE | Admit: 2023-12-01 | Discharge: 2023-12-01 | Disposition: A | Discharge status: Home / Self Care | Payer: Self-pay | Source: Ambulatory Visit | Attending: Primary Care | Admitting: Primary Care

## 2023-12-01 DIAGNOSIS — Z1231 Encounter for screening mammogram for malignant neoplasm of breast: Secondary | ICD-10-CM | POA: Insufficient documentation
